# Patient Record
Sex: Female | Born: 1945 | Race: White | Hispanic: No | Smoking: Never smoker
Health system: Southern US, Community
[De-identification: ages and names within clinical notes are randomized; demographics above are authoritative.]

## PROBLEM LIST (undated history)

## (undated) DIAGNOSIS — E119 Type 2 diabetes mellitus without complications: Secondary | ICD-10-CM

## (undated) DIAGNOSIS — I10 Essential (primary) hypertension: Secondary | ICD-10-CM

## (undated) DIAGNOSIS — Z91018 Allergy to other foods: Secondary | ICD-10-CM

## (undated) DIAGNOSIS — M810 Age-related osteoporosis without current pathological fracture: Secondary | ICD-10-CM

## (undated) HISTORY — PX: COLON SURGERY: SHX602

## (undated) HISTORY — PX: INGUINAL HERNIA REPAIR: SUR1180

---

## 2005-04-24 ENCOUNTER — Emergency Department: Payer: Self-pay | Admitting: Emergency Medicine

## 2005-04-28 ENCOUNTER — Inpatient Hospital Stay: Payer: Self-pay | Admitting: Anesthesiology

## 2005-04-28 ENCOUNTER — Other Ambulatory Visit: Payer: Self-pay

## 2009-04-03 ENCOUNTER — Emergency Department: Payer: Self-pay | Admitting: Emergency Medicine

## 2009-12-07 ENCOUNTER — Emergency Department: Payer: Self-pay | Admitting: Emergency Medicine

## 2014-09-19 ENCOUNTER — Emergency Department: Payer: Self-pay | Admitting: Internal Medicine

## 2015-08-18 ENCOUNTER — Other Ambulatory Visit: Payer: Self-pay

## 2015-08-18 ENCOUNTER — Emergency Department
Admission: EM | Admit: 2015-08-18 | Discharge: 2015-08-18 | Disposition: A | Discharge status: Home / Self Care | Payer: Medicare Other | Attending: Emergency Medicine | Admitting: Emergency Medicine

## 2015-08-18 ENCOUNTER — Emergency Department: Payer: Medicare Other

## 2015-08-18 ENCOUNTER — Encounter: Payer: Self-pay | Admitting: Emergency Medicine

## 2015-08-18 DIAGNOSIS — I10 Essential (primary) hypertension: Secondary | ICD-10-CM | POA: Diagnosis not present

## 2015-08-18 DIAGNOSIS — R079 Chest pain, unspecified: Secondary | ICD-10-CM | POA: Insufficient documentation

## 2015-08-18 DIAGNOSIS — E119 Type 2 diabetes mellitus without complications: Secondary | ICD-10-CM | POA: Diagnosis not present

## 2015-08-18 DIAGNOSIS — Z91040 Latex allergy status: Secondary | ICD-10-CM | POA: Diagnosis not present

## 2015-08-18 HISTORY — DX: Type 2 diabetes mellitus without complications: E11.9

## 2015-08-18 HISTORY — DX: Essential (primary) hypertension: I10

## 2015-08-18 HISTORY — DX: Age-related osteoporosis without current pathological fracture: M81.0

## 2015-08-18 LAB — COMPREHENSIVE METABOLIC PANEL
ALT: 17 U/L (ref 14–54)
ANION GAP: 8 (ref 5–15)
AST: 22 U/L (ref 15–41)
Albumin: 4.2 g/dL (ref 3.5–5.0)
Alkaline Phosphatase: 85 U/L (ref 38–126)
BILIRUBIN TOTAL: 0.8 mg/dL (ref 0.3–1.2)
BUN: 13 mg/dL (ref 6–20)
CO2: 27 mmol/L (ref 22–32)
Calcium: 9.1 mg/dL (ref 8.9–10.3)
Chloride: 105 mmol/L (ref 101–111)
Creatinine, Ser: 0.68 mg/dL (ref 0.44–1.00)
GFR calc non Af Amer: >60 mL/min (ref >60)
Glucose, Bld: 122 mg/dL — ABNORMAL HIGH (ref 65–99)
Potassium: 3.9 mmol/L (ref 3.5–5.1)
Sodium: 140 mmol/L (ref 135–145)
TOTAL PROTEIN: 7 g/dL (ref 6.5–8.1)

## 2015-08-18 LAB — CBC WITH DIFFERENTIAL/PLATELET
Basophils Absolute: 0.1 10*3/uL (ref 0–0.1)
Basophils Relative: 1 %
EOS PCT: 4 %
Eosinophils Absolute: 0.4 10*3/uL (ref 0–0.7)
HCT: 41.9 % (ref 35.0–47.0)
HEMOGLOBIN: 13.8 g/dL (ref 12.0–16.0)
LYMPHS PCT: 31 %
Lymphs Abs: 3.3 10*3/uL (ref 1.0–3.6)
MCH: 29.8 pg (ref 26.0–34.0)
MCHC: 32.9 g/dL (ref 32.0–36.0)
MCV: 90.7 fL (ref 80.0–100.0)
Monocytes Absolute: 0.7 10*3/uL (ref 0.2–0.9)
Monocytes Relative: 7 %
NEUTROS PCT: 57 %
Neutro Abs: 6.1 10*3/uL (ref 1.4–6.5)
PLATELETS: 275 10*3/uL (ref 150–440)
RBC: 4.62 MIL/uL (ref 3.80–5.20)
RDW: 13.2 % (ref 11.5–14.5)
WBC: 10.6 10*3/uL (ref 3.6–11.0)

## 2015-08-18 LAB — FIBRIN DERIVATIVES D-DIMER (ARMC ONLY): FIBRIN DERIVATIVES D-DIMER (ARMC): 417.4 (ref 0–499)

## 2015-08-18 LAB — TROPONIN I: Troponin I: <0.03 ng/mL (ref <0.031)

## 2015-08-18 NOTE — ED Provider Notes (Signed)
Time Seen: Approximately ----------------------------------------- 3:48 PM on 08/18/2015 -----------------------------------------   I have reviewed the triage notes  Chief Complaint: Chest Pain   History of Present Illness: Karen Andrews is a 69 y.o. female who presents with some sudden onset of left-sided chest discomfort while she was shopping today. She states her real sharp pain started the upper part of her left side of her chest and radiated down to her left upper abdominal area. She states that the pain lasted 5 minutes and then since that time she still had developed some left-sided chest aching. She states the initial pain started approximately 10:30 to 11 AM. Patient denies any nausea, vomiting, shortness of breath. States she did have a lot of "" indigestion "" afterwards. She denies any back or flank discomfort. She denies any abdominal pain at present. She denies any fever or chills or pleuritic component to her discomfort. She denies any productive cough or chest wall pain. Patient's cardiac risk factors of hypertension and diabetes. She denies any high cholesterol or family history of ischemic heart disease.  Past Medical History  Diagnosis Date  . Hypertension   . Diabetes mellitus without complication   . Osteoporosis     There are no active problems to display for this patient.   Past Surgical History  Procedure Laterality Date  . Colon surgery    . Inguinal hernia repair      Past Surgical History  Procedure Laterality Date  . Colon surgery    . Inguinal hernia repair      No current outpatient prescriptions on file.  Allergies:  Flexeril; Other; and Latex  Family History: History reviewed. No pertinent family history.  Social History: Social History  Substance Use Topics  . Smoking status: Never Smoker   . Smokeless tobacco: None  . Alcohol Use: No     Review of Systems:   10 point review of systems was performed and was otherwise  negative:  Constitutional: No fever Eyes: No visual disturbances ENT: No sore throat, ear pain Cardiac: No chest pain Respiratory: No shortness of breath, wheezing, or stridor Abdomen: No abdominal pain, no vomiting, No diarrhea Endocrine: No weight loss, No night sweats Extremities: No peripheral edema, cyanosis Skin: No rashes, easy bruising Neurologic: No focal weakness, trouble with speech or swollowing Urologic: No dysuria, Hematuria, or urinary frequency   Physical Exam:  ED Triage Vitals  Enc Vitals Group     BP 08/18/15 1328 175/79 mmHg     Pulse Rate 08/18/15 1328 75     Resp 08/18/15 1328 18     Temp 08/18/15 1328 97.9 F (36.6 C)     Temp Source 08/18/15 1328 Oral     SpO2 08/18/15 1328 100 %     Weight 08/18/15 1328 250 lb (113.399 kg)     Height 08/18/15 1328  (1.727 m)     Head Cir --      Peak Flow --      Pain Score 08/18/15 1532 2     Pain Loc --      Pain Edu? --      Excl. in GC? --     General: Awake , Alert , and Oriented times 3; GCS 15 Head: Normal cephalic , atraumatic Eyes: Pupils equal , round, reactive to light Nose/Throat: No nasal drainage, patent upper airway without erythema or exudate.  Neck: Supple, Full range of motion, No anterior adenopathy or palpable thyroid masses Lungs: Clear to ascultation without wheezes ,  rhonchi, or rales Heart: Regular rate, regular rhythm without murmurs , gallops , or rubs Abdomen: Soft, non tender without rebound, guarding , or rigidity; bowel sounds positive and symmetric in all 4 quadrants. No organomegaly .        Extremities: 2 plus symmetric pulses. No edema, clubbing or cyanosis Neurologic: normal ambulation, Motor symmetric without deficits, sensory intact Skin: warm, dry, no rashes No reproducible chest wall pain  Labs:   All laboratory work was reviewed including any pertinent negatives or positives listed below:  Labs Reviewed  COMPREHENSIVE METABOLIC PANEL  CBC WITH  DIFFERENTIAL/PLATELET  TROPONIN I  FIBRIN DERIVATIVES D-DIMER ([facility] ONLY)   initial and repeat troponins were negative  EKG:  ED ECG REPORT I, Jennye Moccasin, the attending physician, personally viewed and interpreted this ECG.  Date: 08/18/2015 EKG Time: 1333 Rate: 65 Rhythm: normal sinus rhythm QRS Axis: normal Intervals: normal ST/T Wave abnormalities: normal Conduction Disutrbances: none Narrative Interpretation: unremarkable Poor R-wave progression in septal leads Minimal criteria for left ventricular hypertrophy No acute ischemic changes are noted  Radiology:     EXAM: CHEST 2 VIEW  COMPARISON: 12/07/2009  FINDINGS: Heart size and vascularity normal. Negative for heart failure or pneumonia. Negative for mass or effusion. Mild apical scarring bilaterally.  IMPRESSION: No active cardiopulmonary disease.    I personally reviewed the radiologic studies     ED Course: Differential includes all life-threatening causes for chest pain. This includes but is not exclusive to acute coronary syndrome, aortic dissection, pulmonary embolism, cardiac tamponade, community-acquired pneumonia, pericarditis, musculoskeletal chest wall pain, etc. Patient remained hemodynamically stable and not sure of the nature of her chest pain. Does not appear to be a life-threatening injury. At this was unlikely to be pulmonary embolism, unstable angina, aortic dissection etc. Patient did have some reflux-type symptoms at the time of her discomfort. She was advised to double up on her protonix at home. She's been advised to contact her primary physician for further evaluation.    Assessment: Acute unspecified chest pain   Final Clinical Impression: Acute unspecified chest pain  Final diagnoses:  None     Plan:   Patient was advised to return immediately if condition worsens. Patient was advised to follow up with her primary care physician or other specialized physicians  involved and in their current assessment.           Go to top  Jennye Moccasin, MD 08/18/15 2144

## 2015-08-18 NOTE — ED Notes (Signed)
[facility] main lab notified to add D-dimer, spoke with Bjorn Loser, states will add.

## 2015-08-18 NOTE — Discharge Instructions (Signed)

## 2015-08-18 NOTE — ED Notes (Signed)
Reports cp onset around 1230.  Skin w/d with good color.

## 2015-08-26 ENCOUNTER — Emergency Department
Admission: EM | Admit: 2015-08-26 | Discharge: 2015-08-26 | Disposition: A | Discharge status: Home / Self Care | Payer: Medicare Other | Attending: Emergency Medicine | Admitting: Emergency Medicine

## 2015-08-26 ENCOUNTER — Encounter: Payer: Self-pay | Admitting: Emergency Medicine

## 2015-08-26 DIAGNOSIS — I10 Essential (primary) hypertension: Secondary | ICD-10-CM | POA: Insufficient documentation

## 2015-08-26 DIAGNOSIS — E119 Type 2 diabetes mellitus without complications: Secondary | ICD-10-CM | POA: Insufficient documentation

## 2015-08-26 DIAGNOSIS — Z7982 Long term (current) use of aspirin: Secondary | ICD-10-CM | POA: Insufficient documentation

## 2015-08-26 DIAGNOSIS — Z91040 Latex allergy status: Secondary | ICD-10-CM | POA: Diagnosis not present

## 2015-08-26 DIAGNOSIS — F419 Anxiety disorder, unspecified: Secondary | ICD-10-CM | POA: Insufficient documentation

## 2015-08-26 DIAGNOSIS — R070 Pain in throat: Secondary | ICD-10-CM | POA: Insufficient documentation

## 2015-08-26 DIAGNOSIS — R079 Chest pain, unspecified: Secondary | ICD-10-CM | POA: Diagnosis not present

## 2015-08-26 DIAGNOSIS — Z79899 Other long term (current) drug therapy: Secondary | ICD-10-CM | POA: Insufficient documentation

## 2015-08-26 LAB — CBC WITH DIFFERENTIAL/PLATELET
BASOS ABS: 0.1 10*3/uL (ref 0–0.1)
Basophils Relative: 1 %
EOS ABS: 0.2 10*3/uL (ref 0–0.7)
EOS PCT: 2 %
HCT: 41.8 % (ref 35.0–47.0)
HEMOGLOBIN: 14 g/dL (ref 12.0–16.0)
LYMPHS ABS: 1.7 10*3/uL (ref 1.0–3.6)
Lymphocytes Relative: 17 %
MCH: 30.3 pg (ref 26.0–34.0)
MCHC: 33.6 g/dL (ref 32.0–36.0)
MCV: 90.3 fL (ref 80.0–100.0)
Monocytes Absolute: 0.7 10*3/uL (ref 0.2–0.9)
Monocytes Relative: 7 %
NEUTROS PCT: 73 %
Neutro Abs: 7.7 10*3/uL — ABNORMAL HIGH (ref 1.4–6.5)
PLATELETS: 269 10*3/uL (ref 150–440)
RBC: 4.63 MIL/uL (ref 3.80–5.20)
RDW: 12.8 % (ref 11.5–14.5)
WBC: 10.3 10*3/uL (ref 3.6–11.0)

## 2015-08-26 LAB — BASIC METABOLIC PANEL
ANION GAP: 10 (ref 5–15)
BUN: 13 mg/dL (ref 6–20)
CALCIUM: 9.1 mg/dL (ref 8.9–10.3)
CO2: 24 mmol/L (ref 22–32)
Chloride: 104 mmol/L (ref 101–111)
Creatinine, Ser: 0.76 mg/dL (ref 0.44–1.00)
Glucose, Bld: 184 mg/dL — ABNORMAL HIGH (ref 65–99)
POTASSIUM: 3.6 mmol/L (ref 3.5–5.1)
SODIUM: 138 mmol/L (ref 135–145)

## 2015-08-26 LAB — TROPONIN I

## 2015-08-26 MED ORDER — LORAZEPAM 0.5 MG PO TABS: 0.5000 mg | ORAL_TABLET | Freq: Three times a day (TID) | ORAL | Status: AC | PRN

## 2015-08-26 MED ORDER — LORAZEPAM 2 MG/ML IJ SOLN
0.5000 mg | Freq: Once | INTRAMUSCULAR | Status: AC
  Administered 2015-08-26: 0.5 mg via INTRAVENOUS
  Filled 2015-08-26: qty 1

## 2015-08-26 NOTE — ED Notes (Signed)
Brought in via ems from home with possible allergic reaction states she had 2 episodes of feeling like her throat is closing up .haivng some tightness in neck and headache. Lungs clear  No resp distress noted

## 2015-08-26 NOTE — ED Notes (Signed)
States she noticed an episode this am [place] that felt like her throat was closing. Than again this afternoon. Per ems she used her Epi pen prior to their arrival . Was given benadryl by ems. On arrival states she is having some tightness in throat and headache

## 2015-08-26 NOTE — Discharge Instructions (Signed)
It is not clear what is causing the tightness in your throat and the various other symptoms that you've experience over the past 8-10 days. A repeat of your cardiac enzyme and EKG showed no acute changes. Your blood results look good. You have been comfortable and stable during her stay here in the emergency department. There is a possibility that anxiety is triggering some of these events. This does not mean he does not have other medical issues that could also be causing these symptoms area you may take Ativan, 0.5 mg, 2 or 3 times a day, as needed. Follow-up with your regular doctor either at the end of this week or early next week. Return to the emergency department if you have worsening symptoms or other urgent concerns.

## 2015-08-26 NOTE — ED Provider Notes (Signed)
[facility] Emergency Department Provider Note  ____________________________________________  Time seen: 1330  I have reviewed the triage vital signs and the nursing notes.  History is from the patient and from her 2 daughters were present in the room.  HISTORY  Chief Complaint Tightness in throat, constellation of other symptoms.    HPI Karen Andrews is a 69 y.o. female who presents out of concern for having episodes of tightness in her throat. She reports she has had 4-5 of these episodes over the past 2 days. They last approximately 5 minutes. She reports she feels as though she needs to swallow to clear her throat when this occurs. Sometimes she feels a little bit short of breath with this. She denies any stridor and she denies having difficulty swallowing when she is drinking, though she has not tried to drink when she has had these episodes.  The patient was seen in the emergency department August 30 for chest pain. She was released in the emergency department and followed up with her regular doctor who is now scheduled her for a stress test.  In addition to the chest pain that she had at that time, she reports having had some pain in her left groin recently. This occurred last week. She had an occasional episode, but this has resolved and she is not having any pain today.  At this time, the patient is comfortable, without any difficulty breathing, tightness in her throat, difficulty swallowing, or other symptoms.  Patient does report that she has been diagnosed with Alpha Gal.   Past Medical History  Diagnosis Date  . Hypertension   . Diabetes mellitus without complication   . Osteoporosis     There are no active problems to display for this patient.   Past Surgical History  Procedure Laterality Date  . Colon surgery    . Inguinal hernia repair      Current Outpatient Rx  Name  Route  Sig  Dispense  Refill  . aspirin EC 81 MG tablet    Oral   Take 81 mg by mouth at bedtime.         . cetirizine (ZYRTEC) 10 MG tablet   Oral   Take 10 mg by mouth daily.         . Chromium (GTF CHROMIUM) 200 MCG TABS   Oral   Take 200 mcg by mouth 2 (two) times daily.         . ciclopirox (PENLAC) 8 % solution   Topical   Apply 1 application topically at bedtime.         . gabapentin (NEURONTIN) 100 MG capsule   Oral   Take 100 mg by mouth at bedtime.         Marland Kitchen glipiZIDE (GLUCOTROL XL) 10 MG 24 hr tablet   Oral   Take 10 mg by mouth 2 (two) times daily.         Marland Kitchen Hyaluronic Acid-Vitamin C (HYALURONIC ACID PO)   Oral   Take 1 capsule by mouth daily.         Marland Kitchen LORazepam (ATIVAN) 0.5 MG tablet   Oral   Take 1 tablet (0.5 mg total) by mouth every 8 (eight) hours as needed for anxiety.   12 tablet   0   . losartan (COZAAR) 25 MG tablet   Oral   Take 25 mg by mouth at bedtime.         . Multiple Minerals (CALCIUM/MAGNESIUM/ZINC PO)  Oral   Take 1 tablet by mouth at bedtime.         . Multiple Vitamin (MULTIVITAMIN WITH MINERALS) TABS tablet   Oral   Take 1 tablet by mouth daily.         . niacin (NIASPAN) 750 MG CR tablet   Oral   Take 750 mg by mouth at bedtime.         . pantoprazole (PROTONIX) 40 MG tablet   Oral   Take 40 mg by mouth daily.         . Vitamin D, Cholecalciferol, 1000 UNITS CAPS   Oral   Take 1,000 Units by mouth 2 (two) times daily.           Allergies Flexeril; Latex; Nickel; and Other  No family history on file.  Social History Social History  Substance Use Topics  . Smoking status: Never Smoker   . Smokeless tobacco: None  . Alcohol Use: No    Review of Systems  Constitutional: Negative for fever. ENT: Tightness in throat. See history of present illness. Cardiovascular: Chest pain over a week ago. See history of present illness and prior documentation from her visit August 30. Respiratory: Negative for shortness of breath. Gastrointestinal:  Negative for abdominal pain, vomiting and diarrhea. Genitourinary: Negative for dysuria. Musculoskeletal: Patient reports soreness and tenderness in her chest as well as discomfort in her left groin last week. Skin: Negative for rash. Neurological: Negative for headaches   10-point ROS otherwise negative.  ____________________________________________   PHYSICAL EXAM:  VITAL SIGNS: ED Triage Vitals  Enc Vitals Group     BP 08/26/15 1327 142/61 mmHg     Pulse Rate 08/26/15 1327 72     Resp 08/26/15 1327 16     Temp 08/26/15 1327 98.2 F (36.8 C)     Temp Source 08/26/15 1327 Oral     SpO2 08/26/15 1327 97 %     Weight 08/26/15 1327 243 lb (110.224 kg)     Height 08/26/15 1327  (1.727 m)     Head Cir --      Peak Flow --      Pain Score 08/26/15 1328 4     Pain Loc --      Pain Edu? --      Excl. in GC? --     Constitutional:  Alert and oriented. Well appearing and in no distress. ENT   Head: Normocephalic and atraumatic.   Nose: No congestion/rhinnorhea.   Mouth/Throat: Mucous membranes are moist. Cardiovascular: Normal rate, regular rhythm, no murmur noted Chest wall: Notable tenderness to the left pectoralis muscle. No tenderness to the right pectoralis. Respiratory:  Normal respiratory effort, no tachypnea.    Breath sounds are clear and equal bilaterally.  Gastrointestinal: Soft and nontender. No distention. No tenderness over the left inguinal area. No deformity noted. Back: No muscle spasm, no tenderness, no CVA tenderness. Musculoskeletal: No deformity noted. Nontender with normal range of motion in all extremities.  No noted edema. Neurologic:  Normal speech and language. No gross focal neurologic deficits are appreciated. Equal grip strength, negative pronator drift, 5 over 5 strength in all 4 extremities. Skin:  Skin is warm, dry. No rash noted. Psychiatric: Pleasant, communicative and articulate, slightly  anxious. ____________________________________________    LABS (pertinent positives/negatives)  Labs Reviewed  CBC WITH DIFFERENTIAL/PLATELET - Abnormal; Notable for the following:    Neutro Abs 7.7 (*)    All other components within normal limits  BASIC METABOLIC  PANEL - Abnormal; Notable for the following:    Glucose, Bld 184 (*)    All other components within normal limits  TROPONIN I     ____________________________________________   EKG  ED ECG REPORT I, Mylez Venable W, the attending physician, personally viewed and interpreted this ECG.   Date: 08/26/2015  EKG Time: 1357  Rate: 77  Rhythm: Normal sinus rhythm Left ventricular hypertrophy  Axis: Left axis deviation  Intervals: Normal  ST&T Change: None noted   ____________________________________________    INITIAL IMPRESSION / ASSESSMENT AND PLAN / ED COURSE  Pertinent labs & imaging results that were available during my care of the patient were reviewed by me and considered in my medical decision making (see chart for details).  Complicated and unclear situation. This patient has numerous complaints that don't seem to follow a specific pattern. This includes the chest pain, with at least some component that is musculoskeletal, 8-9 days ago, pain in her left groin, which has resolved, and now these intermittent episodes of tightness in her throat and intermittent headaches.  I've considered vascular causes for this set of symptoms, but I do not think the patient has a major vascular issue such as a dissection. She did have a negative D-dimer test when she was seen in the emergency department 8 days ago. There is a chance that her symptoms are connected with her alpha gal diagnosis. I do not think the tightness in her throat is actually an allergic reaction, but it may be reasonable to treat her with low-dose steroids. I have discussed this with the patient and we will readdress again.  I do think anxiety may be  playing a role in the tightness in her throat and others episodes. We will treat her with a small amount of Ativan currently and I discussed having her continue to take an anti-anxiety medicine just for 1 day to see if this helps as well as.  Meanwhile, we will reevaluate a troponin and regular blood work and repeat an EKG just to ensure that this patient who had recent chest pain has not had any new measurable change of these values.  ----------------------------------------- 3:21 PM on 08/26/2015 -----------------------------------------  Labs have been reviewed. Within normal limits except for glucose elevated at 184. The patient's EKG was reasonable with no acute changes. Reexam of the patient finds her comfortable without any ongoing symptoms. During this reexam, I have just been told that the patient did use her EpiPen prior to coming to the emergency department. She did this based on the advice to her from the 911 operator. I do not believe the symptoms she were having before was consistent with an allergy reaction or anaphylaxis. The patient again agrees that he did not feel the same as when she's had anaphylaxis before. We have discussed the pros and cons of steroids and agree not to use steroids at this time.  ____________________________________________   FINAL CLINICAL IMPRESSION(S) / ED DIAGNOSES  Final diagnoses:  Throat discomfort  Anxiety        Darien Ramus, MD 08/26/15 [phone]

## 2015-09-22 DIAGNOSIS — I10 Essential (primary) hypertension: Secondary | ICD-10-CM | POA: Insufficient documentation

## 2015-09-22 DIAGNOSIS — E119 Type 2 diabetes mellitus without complications: Secondary | ICD-10-CM | POA: Insufficient documentation

## 2016-06-08 DIAGNOSIS — K219 Gastro-esophageal reflux disease without esophagitis: Secondary | ICD-10-CM | POA: Insufficient documentation

## 2016-12-28 ENCOUNTER — Emergency Department
Admission: EM | Admit: 2016-12-28 | Discharge: 2016-12-28 | Disposition: A | Discharge status: Home / Self Care | Payer: Medicare Other | Attending: Emergency Medicine | Admitting: Emergency Medicine

## 2016-12-28 ENCOUNTER — Emergency Department: Payer: Medicare Other

## 2016-12-28 DIAGNOSIS — I10 Essential (primary) hypertension: Secondary | ICD-10-CM | POA: Diagnosis not present

## 2016-12-28 DIAGNOSIS — Z7984 Long term (current) use of oral hypoglycemic drugs: Secondary | ICD-10-CM | POA: Diagnosis not present

## 2016-12-28 DIAGNOSIS — Z91040 Latex allergy status: Secondary | ICD-10-CM | POA: Insufficient documentation

## 2016-12-28 DIAGNOSIS — E119 Type 2 diabetes mellitus without complications: Secondary | ICD-10-CM | POA: Insufficient documentation

## 2016-12-28 DIAGNOSIS — R42 Dizziness and giddiness: Secondary | ICD-10-CM | POA: Insufficient documentation

## 2016-12-28 DIAGNOSIS — Z7982 Long term (current) use of aspirin: Secondary | ICD-10-CM | POA: Diagnosis not present

## 2016-12-28 LAB — URINALYSIS, COMPLETE (UACMP) WITH MICROSCOPIC
Bilirubin Urine: NEGATIVE
Glucose, UA: NEGATIVE mg/dL
Hgb urine dipstick: NEGATIVE
KETONES UR: NEGATIVE mg/dL
Leukocytes, UA: NEGATIVE
Nitrite: NEGATIVE
PROTEIN: NEGATIVE mg/dL
Specific Gravity, Urine: 1.006 (ref 1.005–1.030)
pH: 7 (ref 5.0–8.0)

## 2016-12-28 LAB — CBC
HCT: 41.6 % (ref 35.0–47.0)
HEMOGLOBIN: 14 g/dL (ref 12.0–16.0)
MCH: 30.7 pg (ref 26.0–34.0)
MCHC: 33.7 g/dL (ref 32.0–36.0)
MCV: 91.1 fL (ref 80.0–100.0)
PLATELETS: 279 10*3/uL (ref 150–440)
RBC: 4.56 MIL/uL (ref 3.80–5.20)
RDW: 13 % (ref 11.5–14.5)
WBC: 8 10*3/uL (ref 3.6–11.0)

## 2016-12-28 LAB — BASIC METABOLIC PANEL
Anion gap: 9 (ref 5–15)
BUN: 12 mg/dL (ref 6–20)
CHLORIDE: 102 mmol/L (ref 101–111)
CO2: 27 mmol/L (ref 22–32)
CREATININE: 0.72 mg/dL (ref 0.44–1.00)
Calcium: 8.9 mg/dL (ref 8.9–10.3)
GFR calc non Af Amer: >60 mL/min (ref >60)
GLUCOSE: 203 mg/dL — AB (ref 65–99)
Potassium: 4.1 mmol/L (ref 3.5–5.1)
Sodium: 138 mmol/L (ref 135–145)

## 2016-12-28 LAB — TROPONIN I

## 2016-12-28 MED ORDER — MECLIZINE HCL 25 MG PO TABS: 25.0000 mg | ORAL_TABLET | Freq: Three times a day (TID) | ORAL | 0 refills | Status: AC | PRN

## 2016-12-28 MED ORDER — MECLIZINE HCL 25 MG PO TABS
25.0000 mg | ORAL_TABLET | Freq: Once | ORAL | Status: AC
  Administered 2016-12-28: 25 mg via ORAL
  Filled 2016-12-28 (×2): qty 1

## 2016-12-28 NOTE — ED Triage Notes (Signed)
Pt reports previously was told it was inner ear but reports no pain to ear now. Pt reports does have some pain to her cheek area occasional but thinks it is a root canal

## 2016-12-28 NOTE — ED Provider Notes (Signed)
[facility] Emergency Department Provider Note   ____________________________________________   First MD Initiated Contact with Patient 12/28/16 1423     (approximate)  I have reviewed the triage vital signs and the nursing notes.   HISTORY  Chief Complaint Weakness and Dizziness   HPI Karen Andrews is a 71 y.o. female with a history of hypertension, diabetes and TIA on aspirin, 81 mg daily, who is presenting to the emergency department today with lightheadedness and difficulty ambulating. She says that the symptoms started at about 745 this morning. She says they're worse with movement and walking. She denies any pain. Denies any nausea or vomiting. Said that her TIA symptoms felt similarly in the past. She denies any ear pain. Says that she has intermittent ringing in her ears which is chronic. She denies any weakness or numbness.  She denies a sensation of the room spinning when she says she feels more lightheaded with a fullness in her head. She is also concerned because several days ago she had a car door hit her in the head and she said she "saw stars." However, she denies losing consciousness.   Past Medical History:  Diagnosis Date  . Diabetes mellitus without complication   . Hypertension   . Osteoporosis     There are no active problems to display for this patient.   Past Surgical History:  Procedure Laterality Date  . COLON SURGERY    . INGUINAL HERNIA REPAIR      Prior to Admission medications   Medication Sig Start Date End Date Taking? Authorizing Provider  aspirin EC 81 MG tablet Take 81 mg by mouth at bedtime.    Historical Provider, MD  cetirizine (ZYRTEC) 10 MG tablet Take 10 mg by mouth daily.    Historical Provider, MD  Chromium (GTF CHROMIUM) 200 MCG TABS Take 200 mcg by mouth 2 (two) times daily.    Historical Provider, MD  ciclopirox (PENLAC) 8 % solution Apply 1 application topically at bedtime.    Historical Provider, MD   gabapentin (NEURONTIN) 100 MG capsule Take 100 mg by mouth at bedtime.    Historical Provider, MD  glipiZIDE (GLUCOTROL XL) 10 MG 24 hr tablet Take 10 mg by mouth 2 (two) times daily.    Historical Provider, MD  Hyaluronic Acid-Vitamin C (HYALURONIC ACID PO) Take 1 capsule by mouth daily.    Historical Provider, MD  losartan (COZAAR) 25 MG tablet Take 25 mg by mouth at bedtime.    Historical Provider, MD  Multiple Minerals (CALCIUM/MAGNESIUM/ZINC PO) Take 1 tablet by mouth at bedtime.    Historical Provider, MD  Multiple Vitamin (MULTIVITAMIN WITH MINERALS) TABS tablet Take 1 tablet by mouth daily.    Historical Provider, MD  niacin (NIASPAN) 750 MG CR tablet Take 750 mg by mouth at bedtime.    Historical Provider, MD  pantoprazole (PROTONIX) 40 MG tablet Take 40 mg by mouth daily.    Historical Provider, MD  Vitamin D, Cholecalciferol, 1000 UNITS CAPS Take 1,000 Units by mouth 2 (two) times daily.    Historical Provider, MD    Allergies Flexeril [cyclobenzaprine]; Latex; Nickel; and Other  No family history on file.  Social History Social History  Substance Use Topics  . Smoking status: Never Smoker  . Smokeless tobacco: Not on file  . Alcohol use No    Review of Systems Constitutional: No fever/chills Eyes: No visual changes. ENT: No sore throat. Cardiovascular: Denies chest pain. Respiratory: Denies shortness of breath. Gastrointestinal:  No abdominal pain.  No nausea, no vomiting.  No diarrhea.  No constipation. Genitourinary: Negative for dysuria. Musculoskeletal: Negative for back pain. Skin: Negative for rash. Neurological: Negative for headaches, focal weakness or numbness.  10-point ROS otherwise negative.  ____________________________________________   PHYSICAL EXAM:  VITAL SIGNS: ED Triage Vitals  Enc Vitals Group     BP 12/28/16 1053 (!) 148/74     Pulse Rate 12/28/16 1053 65     Resp 12/28/16 1053 18     Temp 12/28/16 1053 97.9 F (36.6 C)     Temp  Source 12/28/16 1053 Oral     SpO2 12/28/16 1053 97 %     Weight 12/28/16 1054 250 lb (113.4 kg)     Height 12/28/16 1054 5\' 8"  (1.727 m)     Head Circumference --      Peak Flow --      Pain Score --      Pain Loc --      Pain Edu? --      Excl. in GC? --     Constitutional: Alert and oriented. Well appearing and in no acute distress. Eyes: Conjunctivae are normal. PERRL. EOMI. leftward going nystagmus. No vertical nystagmus. Head: Atraumatic. TMs are normal bilaterally. Nose: No congestion/rhinnorhea. Mouth/Throat: Mucous membranes are moist.  Neck: No stridor.   Cardiovascular: Normal rate, regular rhythm. Grossly normal heart sounds.   Respiratory: Normal respiratory effort.  No retractions. Lungs CTAB. Gastrointestinal: Soft and nontender. No distention.  Musculoskeletal: No lower extremity tenderness nor edema.  No joint effusions. Neurologic:  Normal speech and language. No gross focal neurologic deficits are appreciated. No ataxia on finger to nose testing. Skin:  Skin is warm, dry and intact. No rash noted. Psychiatric: Mood and affect are normal. Speech and behavior are normal.  NIH Stroke Scale   Time: 2:53 PM Person Administering Scale: Arelia Longest  Administer stroke scale items in the order listed. Record performance in each category after each subscale exam. Do not go back and change scores. Follow directions provided for each exam technique. Scores should reflect what the patient does, not what the clinician thinks the patient can do. The clinician should record answers while administering the exam and work quickly. Except where indicated, the patient should not be coached (i.e., repeated requests to patient to make a special effort).   1a  Level of consciousness: 0=alert; keenly responsive  1b. LOC questions:  0=Performs both tasks correctly  1c. LOC commands: 0=Performs both tasks correctly  2.  Best Gaze: 0=normal  3.  Visual: 0=No visual loss  4.  Facial Palsy: 0=Normal symmetric movement  5a.  Motor left arm: 0=No drift, limb holds 90 (or 45) degrees for full 10 seconds  5b.  Motor right arm: 0=No drift, limb holds 90 (or 45) degrees for full 10 seconds  6a. motor left leg: 0=No drift, limb holds 90 (or 45) degrees for full 10 seconds  6b  Motor right leg:  0=No drift, limb holds 90 (or 45) degrees for full 10 seconds  7. Limb Ataxia: 0=Absent  8.  Sensory: 0=Normal; no sensory loss  9. Best Language:  0=No aphasia, normal  10. Dysarthria: 0=Normal  11. Extinction and Inattention: 0=No abnormality  12. Distal motor function: 0=Normal   Total:   0    ____________________________________________   LABS (all labs ordered are listed, but only abnormal results are displayed)  Labs Reviewed  BASIC METABOLIC PANEL - Abnormal; Notable for the following:  Result Value   Glucose, Bld 203 (*)    All other components within normal limits  CBC  URINALYSIS, COMPLETE (UACMP) WITH MICROSCOPIC  TROPONIN I  CBG MONITORING, ED   ____________________________________________  EKG  ED ECG REPORT I, Torianne Laflam,  Teena Irani, the attending physician, personally viewed and interpreted this ECG.   Date: 12/28/2016  EKG Time: 1106  Rate: 63  Rhythm: normal sinus rhythm  Axis: left axis  Intervals:incomplete right bundle branch block  ST&T Change: No ST segment elevation or depression. No abnormal T-wave inversion.  ____________________________________________  RADIOLOGY  MR Brain Wo Contrast (Final result)  Result time 12/28/16 16:44:40  Final result by Davonna Belling, MD (12/28/16 16:44:40)           Narrative:   CLINICAL DATA: Weak and dizzy when getting out of bed earlier today. Vertigo. Gait abnormality. No focal neurologic deficit is reported.  EXAM: MRI HEAD WITHOUT CONTRAST  TECHNIQUE: Multiplanar, multiecho pulse sequences of the brain and surrounding structures were obtained without intravenous  contrast.  COMPARISON: CT head 12/28/2016 at 1452 hours. MR head 04/29/2005.  FINDINGS: Brain: No evidence for acute infarction, hemorrhage, mass lesion, hydrocephalus, or extra-axial fluid. Minor subcortical and periventricular white matter disease, nonspecific, consistent with early chronic microvascular ischemic change, given the patient's hypertension and diabetes history.  Vascular: Normal flow voids.  Skull and upper cervical spine: Normal marrow signal.  Sinuses/Orbits: Negative.  Other: None.  IMPRESSION: Mild atrophy. Minor white matter disease.  No acute intracranial findings.  No evidence for posterior circulation ischemia, intracranial mass lesion, or acute vascular occlusion.   Electronically Signed By: Elsie Stain M.D. On: 12/28/2016 16:44            CT Head Wo Contrast (Final result)  Result time 12/28/16 14:58:56  Final result by Oley Balm, MD (12/28/16 14:58:56)           Narrative:   CLINICAL DATA: Patient complains of dizziness since this morning. Hx of melanoma. Hx of TIA.  EXAM: CT HEAD WITHOUT CONTRAST  TECHNIQUE: Contiguous axial images were obtained from the base of the skull through the vertex without intravenous contrast.  COMPARISON: MR 04/29/2005  FINDINGS: Brain: No evidence of acute infarction, hemorrhage, hydrocephalus, extra-axial collection or mass lesion/mass effect. Mild atrophy.  Vascular: No hyperdense vessel or unexpected calcification.  Skull: Normal. Negative for fracture or focal lesion.  Sinuses/Orbits: No acute finding.  Other: None.  IMPRESSION: 1. Negative for bleed or other acute intracranial process.   Electronically Signed By: Corlis Leak M.D. On: 12/28/2016 14:58            ____________________________________________   PROCEDURES  Procedure(s) performed: None  Procedures  Critical Care performed:  No  ____________________________________________   INITIAL IMPRESSION / ASSESSMENT AND PLAN / ED COURSE  Pertinent labs & imaging results that were available during my care of the patient were reviewed by me and considered in my medical decision making (see chart for details).   Clinical Course   ----------------------------------------- 2:54 PM on 12/28/2016 -----------------------------------------  I discussed the case with Dr. Thad Ranger of neurology who agrees that the patient has an MRI and feels improved with meclizine that she is appropriate for discharge to home with outpatient neurology follow-up.  ----------------------------------------- 5:18 PM on 12/28/2016 -----------------------------------------  Patient feels improved with meclizine. Very reassuring MRI as well as CT of the brain. She is able to ambulate with a normal gait. She'll be discharged with meclizine. She will continue her aspirin. We discussed follow-up with  neurology and she is understanding and willing to comply. She knows to return to the hospital for any worsening or concerning symptoms for reevaluation. Likely peripheral vertigo. Symptoms which did not resolve within one hour without signs of stroke on MRI.   ____________________________________________   FINAL CLINICAL IMPRESSION(S) / ED DIAGNOSES  Vertigo.    NEW MEDICATIONS STARTED DURING THIS VISIT:  New Prescriptions   No medications on file     Note:  This document was prepared using Dragon voice recognition software and may include unintentional dictation errors.    Myrna Blazeravid Matthew Olando Willems, MD 12/28/16 [phone]

## 2016-12-28 NOTE — ED Notes (Signed)
Pt alert and oriented X4, active, cooperative, pt in NAD. RR even and unlabored, color WNL.  Pt informed to return if any life threatening symptoms occur.   

## 2016-12-28 NOTE — ED Triage Notes (Signed)
Pt reports this am felt weak and dizzy when she got up out of bed. Pt denies pain, PERRLA, grips equal bilaterally, no facial drooping noted. Pt reports doesn't feel well. Pt reports has felt dizzy in the past before where the room was spinning.

## 2018-05-08 DIAGNOSIS — E1140 Type 2 diabetes mellitus with diabetic neuropathy, unspecified: Secondary | ICD-10-CM | POA: Insufficient documentation

## 2018-06-28 DIAGNOSIS — E042 Nontoxic multinodular goiter: Secondary | ICD-10-CM | POA: Insufficient documentation

## 2020-08-22 ENCOUNTER — Emergency Department
Admission: EM | Admit: 2020-08-22 | Discharge: 2020-08-22 | Disposition: A | Discharge status: Home / Self Care | Payer: Medicare Other | Attending: Emergency Medicine | Admitting: Emergency Medicine

## 2020-08-22 ENCOUNTER — Emergency Department: Payer: Medicare Other

## 2020-08-22 ENCOUNTER — Encounter: Payer: Self-pay | Admitting: Intensive Care

## 2020-08-22 ENCOUNTER — Other Ambulatory Visit: Payer: Self-pay

## 2020-08-22 DIAGNOSIS — Z7982 Long term (current) use of aspirin: Secondary | ICD-10-CM | POA: Diagnosis not present

## 2020-08-22 DIAGNOSIS — I10 Essential (primary) hypertension: Secondary | ICD-10-CM | POA: Diagnosis not present

## 2020-08-22 DIAGNOSIS — Z91040 Latex allergy status: Secondary | ICD-10-CM | POA: Insufficient documentation

## 2020-08-22 DIAGNOSIS — Z79899 Other long term (current) drug therapy: Secondary | ICD-10-CM | POA: Insufficient documentation

## 2020-08-22 DIAGNOSIS — R079 Chest pain, unspecified: Secondary | ICD-10-CM | POA: Diagnosis not present

## 2020-08-22 DIAGNOSIS — E119 Type 2 diabetes mellitus without complications: Secondary | ICD-10-CM | POA: Insufficient documentation

## 2020-08-22 HISTORY — DX: Allergy to other foods: Z91.018

## 2020-08-22 LAB — BASIC METABOLIC PANEL
Anion gap: 8 (ref 5–15)
BUN: 11 mg/dL (ref 8–23)
CO2: 27 mmol/L (ref 22–32)
Calcium: 8.6 mg/dL — ABNORMAL LOW (ref 8.9–10.3)
Chloride: 101 mmol/L (ref 98–111)
Creatinine, Ser: 0.76 mg/dL (ref 0.44–1.00)
GFR calc Af Amer: >60 mL/min (ref >60)
GFR calc non Af Amer: >60 mL/min (ref >60)
Glucose, Bld: 147 mg/dL — ABNORMAL HIGH (ref 70–99)
Potassium: 4 mmol/L (ref 3.5–5.1)
Sodium: 136 mmol/L (ref 135–145)

## 2020-08-22 LAB — TROPONIN I (HIGH SENSITIVITY)
Troponin I (High Sensitivity): 4 ng/L (ref <18)
Troponin I (High Sensitivity): 3 ng/L (ref <18)

## 2020-08-22 LAB — CBC
HCT: 43.8 % (ref 36.0–46.0)
Hemoglobin: 14.7 g/dL (ref 12.0–15.0)
MCH: 31 pg (ref 26.0–34.0)
MCHC: 33.6 g/dL (ref 30.0–36.0)
MCV: 92.4 fL (ref 80.0–100.0)
Platelets: 289 10*3/uL (ref 150–400)
RBC: 4.74 MIL/uL (ref 3.87–5.11)
RDW: 12 % (ref 11.5–15.5)
WBC: 6.1 10*3/uL (ref 4.0–10.5)
nRBC: 0 % (ref 0.0–0.2)

## 2020-08-22 NOTE — ED Triage Notes (Signed)
Presents with sharp/sore left sided chest pain. Radiation to left neck

## 2020-08-22 NOTE — ED Notes (Signed)
Pt presents to ED for L sided CP that started around 0630 this AM, pt states pain radiated across her chest. Denies fever, SOB, NVD. Pain has subsided at this time but feels soreness. Denies cardiac hx. Pt A&Ox4 and NAD at this time.

## 2020-08-22 NOTE — ED Notes (Signed)
Collected green top and lav top, sent to lab pending orders.

## 2020-08-22 NOTE — ED Provider Notes (Signed)
[facility] Emergency Department Provider Note   ____________________________________________   First MD Initiated Contact with Patient 08/22/20 1322     (approximate)  I have reviewed the triage vital signs and the nursing notes.   HISTORY  Chief Complaint Chest Pain    HPI Karen Andrews is a 74 y.o. female with past medical history of hypertension and diabetes who presents to the ED complaining of chest pain.  Patient reports that she was laying in bed this morning around 6:30 AM when she experienced a sharp stabbing pain radiating from the left side to the center of her chest.  Pain lasted for a couple minutes before resolving on its own, but she had 2 additional episodes of similar pain.  These episodes again lasted for a couple minutes before resolving on their own.  She decided to get checked out in the ED, but now states that her pain is resolved and she has not had any further episodes since this morning.  She denies any associated fevers, cough, or shortness of breath.  She does not have any cardiac history and denies any history of similar symptoms.        Past Medical History:  Diagnosis Date  . Allergy to alpha-gal   . Diabetes mellitus without complication (HCC)   . Hypertension   . Osteoporosis     There are no problems to display for this patient.   Past Surgical History:  Procedure Laterality Date  . COLON SURGERY    . INGUINAL HERNIA REPAIR      Prior to Admission medications   Medication Sig Start Date End Date Taking? Authorizing Provider  aspirin EC 81 MG tablet Take 81 mg by mouth at bedtime.    [provider]  cetirizine (ZYRTEC) 10 MG tablet Take 10 mg by mouth daily.    [provider]  Chromium (GTF CHROMIUM) 200 MCG TABS Take 200 mcg by mouth 2 (two) times daily.    [provider]  ciclopirox (PENLAC) 8 % solution Apply 1 application topically at bedtime.    [provider]    gabapentin (NEURONTIN) 100 MG capsule Take 100 mg by mouth at bedtime.    [provider]  glipiZIDE (GLUCOTROL XL) 10 MG 24 hr tablet Take 10 mg by mouth 2 (two) times daily.    [provider]  Hyaluronic Acid-Vitamin C (HYALURONIC ACID PO) Take 1 capsule by mouth daily.    [provider]  losartan (COZAAR) 25 MG tablet Take 25 mg by mouth at bedtime.    [provider]  meclizine (ANTIVERT) 25 MG tablet Take 1 tablet (25 mg total) by mouth 3 (three) times daily as needed for dizziness. 12/28/16   Myrna Blazer, MD  Multiple Minerals (CALCIUM/MAGNESIUM/ZINC PO) Take 1 tablet by mouth at bedtime.    [provider]  Multiple Vitamin (MULTIVITAMIN WITH MINERALS) TABS tablet Take 1 tablet by mouth daily.    [provider]  niacin (NIASPAN) 750 MG CR tablet Take 750 mg by mouth at bedtime.    [provider]  pantoprazole (PROTONIX) 40 MG tablet Take 40 mg by mouth daily.    [provider]  Vitamin D, Cholecalciferol, 1000 UNITS CAPS Take 1,000 Units by mouth 2 (two) times daily.    [provider]    Allergies Flexeril [cyclobenzaprine], Meat [alpha-gal], Latex, Nickel, and Other  History reviewed. No pertinent family history.  Social History Social History   Tobacco Use  .  Smoking status: Never Smoker  . Smokeless tobacco: Never Used  Substance Use Topics  . Alcohol use: No  . Drug use: Never    Review of Systems  Constitutional: No fever/chills Eyes: No visual changes. ENT: No sore throat. Cardiovascular: Positive for chest pain. Respiratory: Denies shortness of breath. Gastrointestinal: No abdominal pain.  No nausea, no vomiting.  No diarrhea.  No constipation. Genitourinary: Negative for dysuria. Musculoskeletal: Negative for back pain. Skin: Negative for rash. Neurological: Negative for headaches, focal weakness or  numbness.  ____________________________________________   PHYSICAL EXAM:  VITAL SIGNS: ED Triage Vitals  Enc Vitals Group     BP 08/22/20 0911 (!) 146/63     Pulse Rate 08/22/20 0911 63     Resp 08/22/20 0911 20     Temp 08/22/20 0911 98.3 F (36.8 C)     Temp Source 08/22/20 0911 Oral     SpO2 08/22/20 0911 97 %     Weight 08/22/20 0912 230 lb (104.3 kg)     Height 08/22/20 0912 5\' 8"  (1.727 m)     Head Circumference --      Peak Flow --      Pain Score 08/22/20 0930 2     Pain Loc --      Pain Edu? --      Excl. in GC? --     Constitutional: Alert and oriented. Eyes: Conjunctivae are normal. Head: Atraumatic. Nose: No congestion/rhinnorhea. Mouth/Throat: Mucous membranes are moist. Neck: Normal ROM Cardiovascular: Normal rate, regular rhythm. Grossly normal heart sounds. Respiratory: Normal respiratory effort.  No retractions. Lungs CTAB.  No chest wall tenderness to palpation. Gastrointestinal: Soft and nontender. No distention. Genitourinary: deferred Musculoskeletal: No lower extremity tenderness nor edema. Neurologic:  Normal speech and language. No gross focal neurologic deficits are appreciated. Skin:  Skin is warm, dry and intact. No rash noted. Psychiatric: Mood and affect are normal. Speech and behavior are normal.  ____________________________________________   LABS (all labs ordered are listed, but only abnormal results are displayed)  Labs Reviewed  BASIC METABOLIC PANEL - Abnormal; Notable for the following components:      Result Value   Glucose, Bld 147 (*)    Calcium 8.6 (*)    All other components within normal limits  CBC  TROPONIN I (HIGH SENSITIVITY)  TROPONIN I (HIGH SENSITIVITY)   ____________________________________________  EKG  ED ECG REPORT I, 10/22/20, the attending physician, personally viewed and interpreted this ECG.   Date: 08/22/2020  EKG Time: 9:02  Rate: 62  Rhythm: normal sinus rhythm  Axis: LAD   Intervals:none  ST&T Change: LVH   PROCEDURES  Procedure(s) performed (including Critical Care):  Procedures   ____________________________________________   INITIAL IMPRESSION / ASSESSMENT AND PLAN / ED COURSE       74 year old female with past medical history of hypertension and diabetes presents to the ED with 3 separate episodes of sharp stabbing chest pain over her left chest starting this morning, each of which lasted a couple minutes and then has resolved.  She has been asymptomatic since then, denies any chest pain currently and has not had any shortness of breath.  Vital signs are reassuring, EKG shows no evidence of arrhythmia or ischemia.  Chest x-ray is also unremarkable and initial labs reassuring, troponin within normal limits.  We will repeat troponin but if this is within normal limits I doubt ACS as the source of her symptoms.  As she is asymptomatic at this time with reassuring vital signs,  I doubt PE.  She would be appropriate for discharge home with PCP follow-up if repeat troponin within normal limits.  Repeat troponin within normal limits and patient remains symptom-free at this time.  She is appropriate for discharge home with PCP follow-up, was counseled to return to the ED for new or worsening symptoms.  Patient agrees with plan.      ____________________________________________   FINAL CLINICAL IMPRESSION(S) / ED DIAGNOSES  Final diagnoses:  Nonspecific chest pain     ED Discharge Orders    None       Note:  This document was prepared using Dragon voice recognition software and may include unintentional dictation errors.   Chesley Noon, MD 08/22/20 [phone]

## 2020-11-05 IMAGING — CR DG CHEST 2V
1 series · 2 of 2 positions shown · non-contrast
Comparison: August 18, 2015.

CLINICAL DATA: Chest pain.

EXAM:
CHEST - 2 VIEW

[Series 1: dg chest 2 view · 0.14mm/px · 2 of 2 slices shown]
[im 1/2]
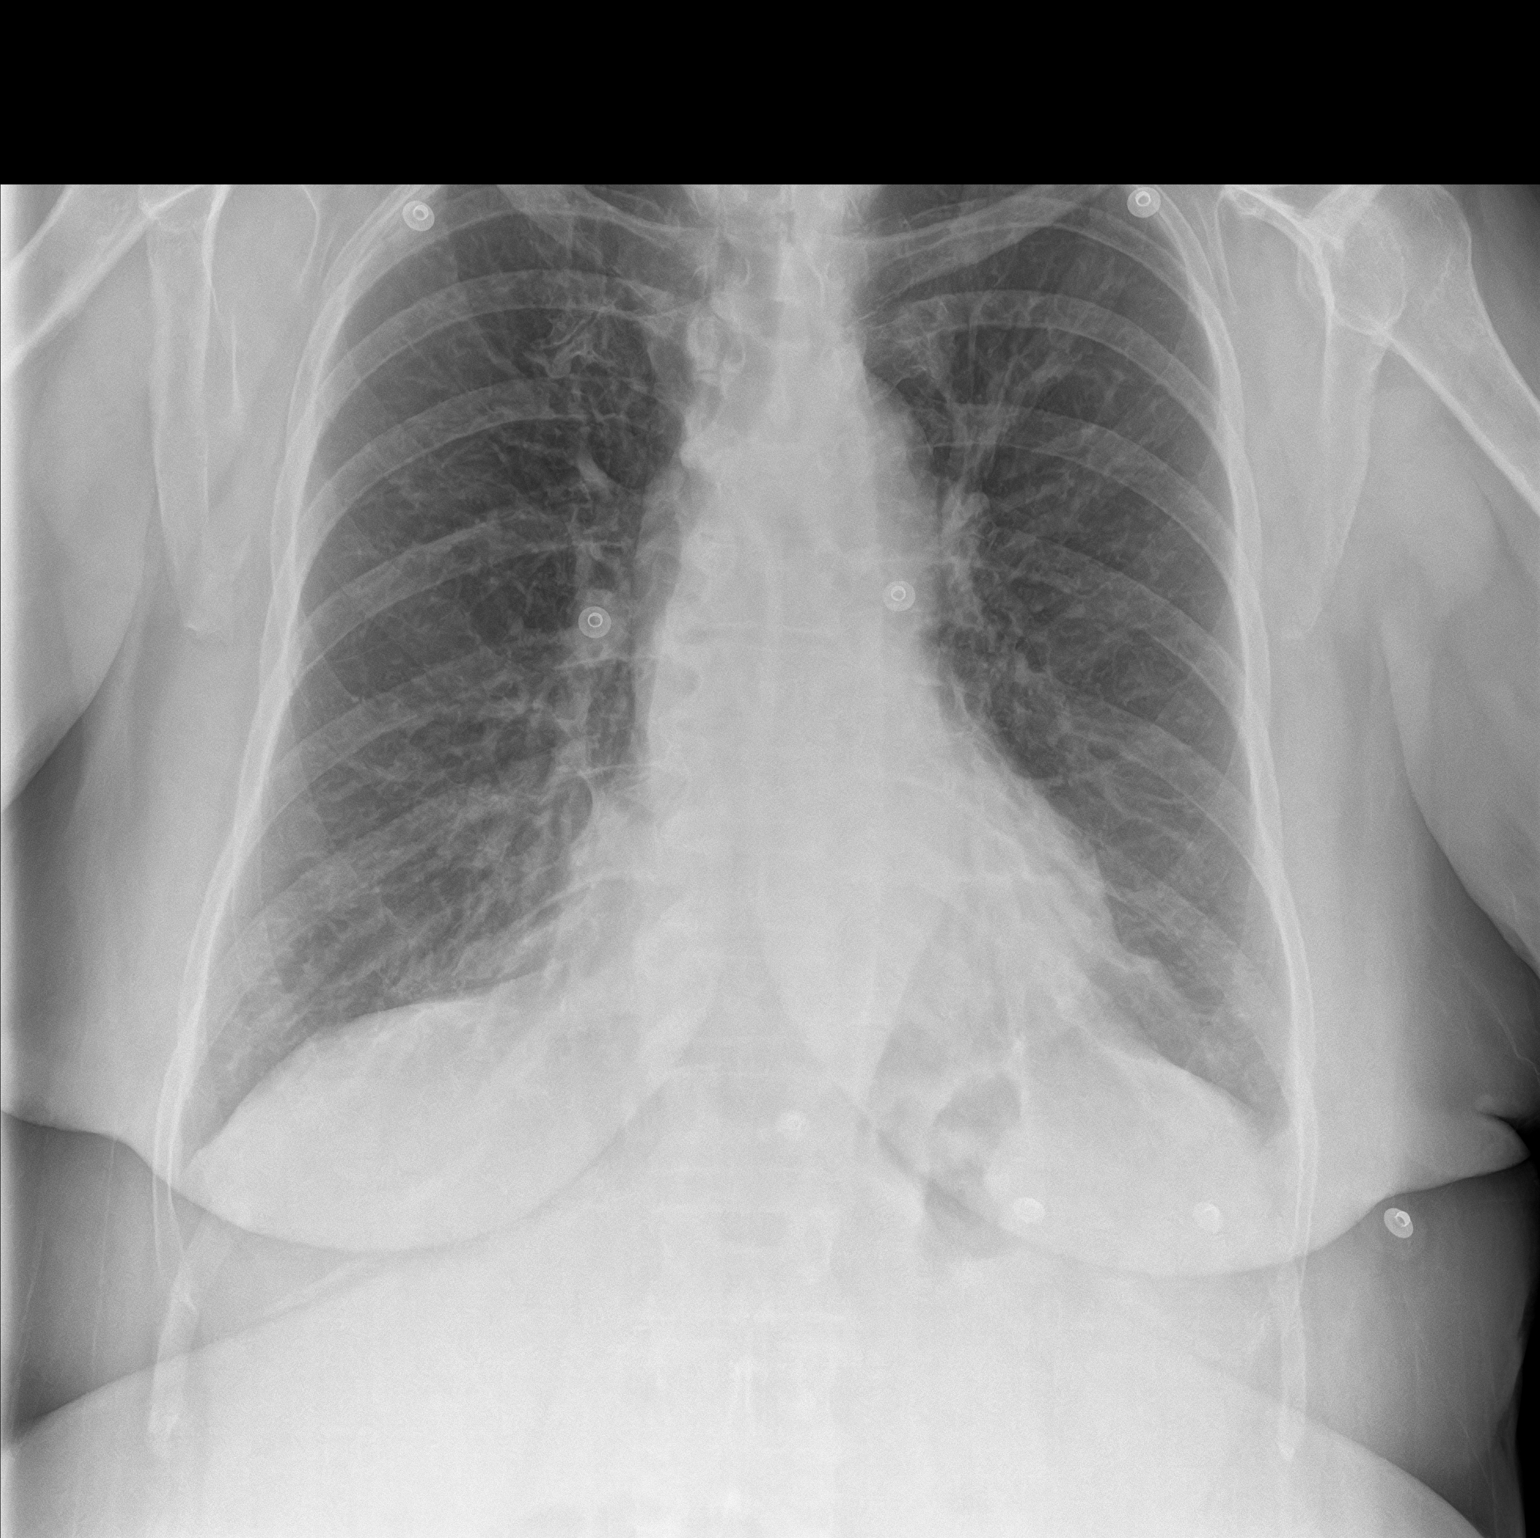
[im 2/2]
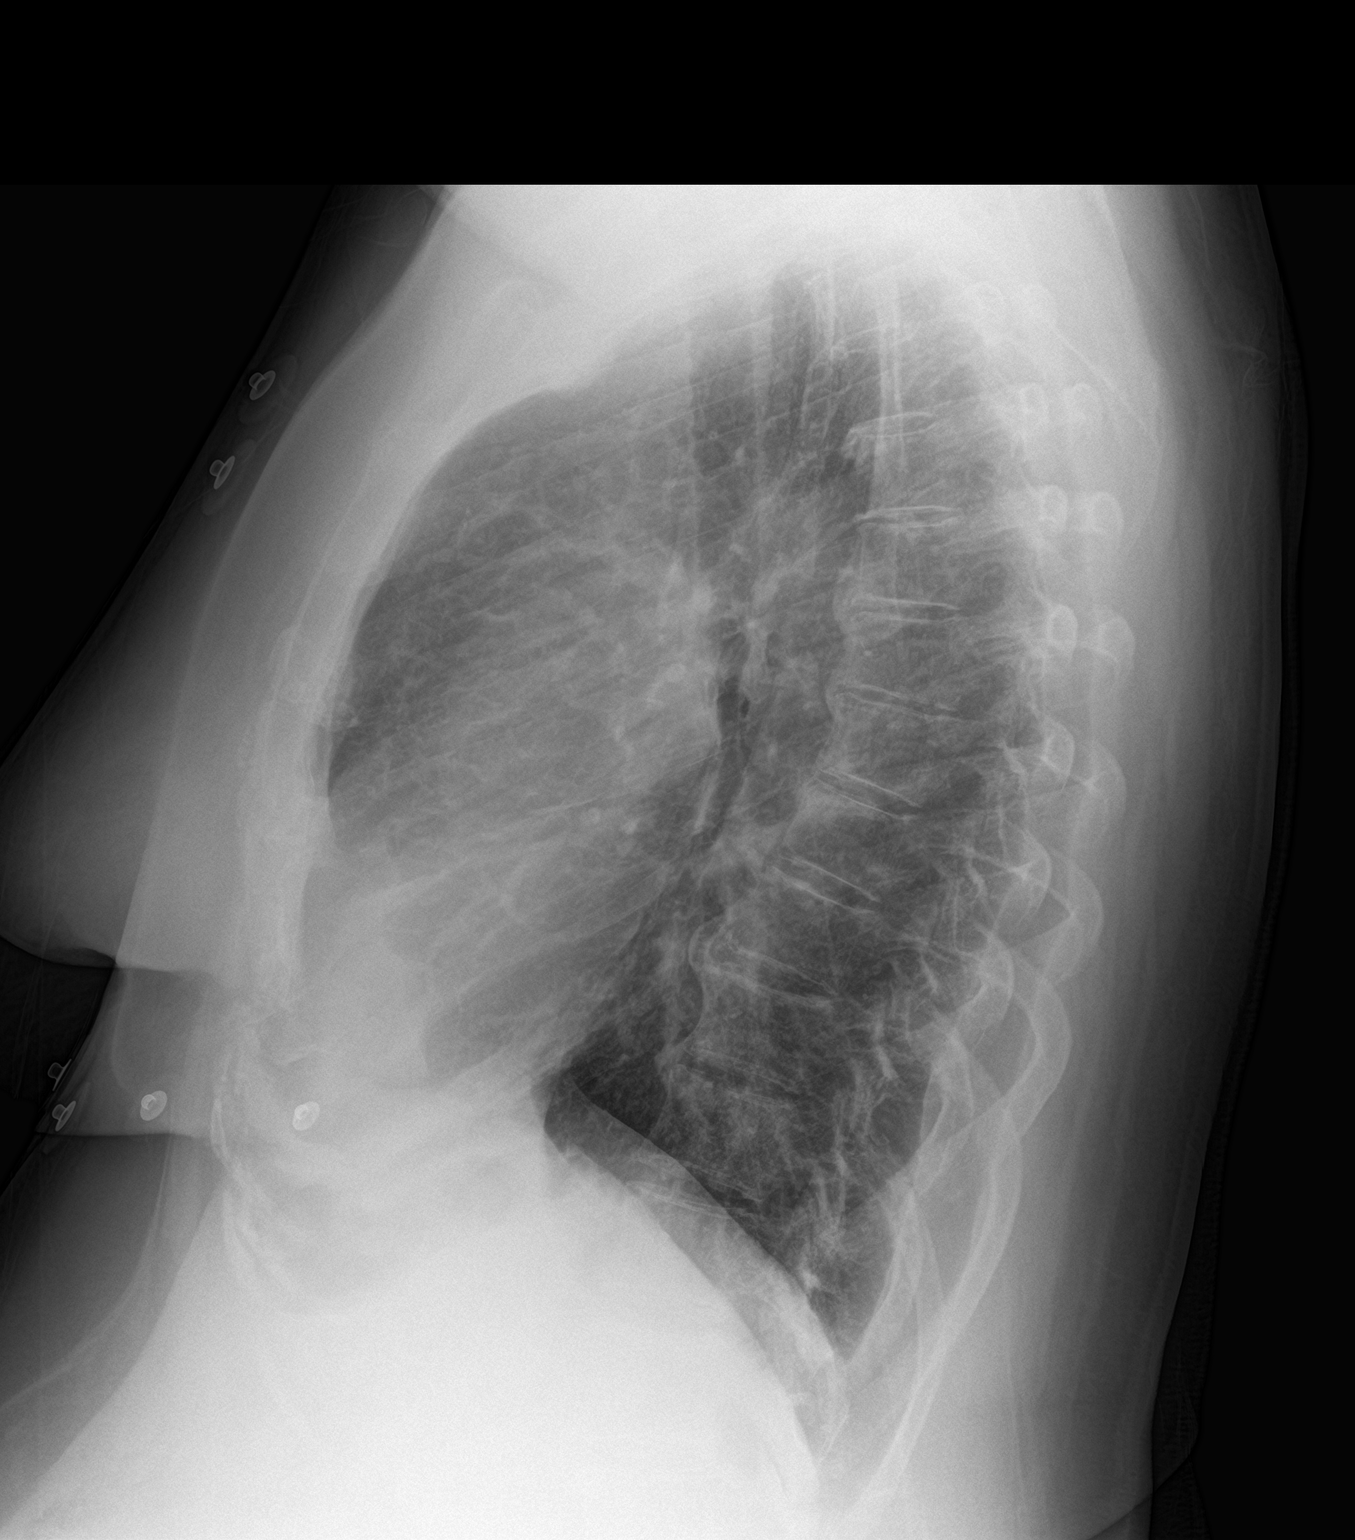

[2 of 2 positions shown; findings below may reference images not displayed]

FINDINGS: The heart size and mediastinal contours are within normal limits.
Both lungs are clear. No pneumothorax or pleural effusion is noted.
The visualized skeletal structures are unremarkable.
IMPRESSION: No active cardiopulmonary disease.

## 2021-03-12 ENCOUNTER — Ambulatory Visit: Payer: Medicare Other

## 2021-03-12 NOTE — Progress Notes (Signed)
[facility] [address] [place], [place] [address]  Pulmonary Sleep Medicine   Office Visit Note  Patient Name: Karen Andrews DOB: 12/19/1946 MRN [id number]    Chief Complaint: Obstructive Sleep Apnea visit  Brief History:  Karen Andrews is seen today for follow up The patient has a 14 year history of sleep apnea. Patient is using PAP nightly.  The patient feels more rested after sleeping with PAP.  The patient reports benefiting from PAP use. Reported sleepiness is  improved and the Epworth Sleepiness Score is 4 out of 24. The patient rarely take naps. The patient complains of the following: no complaints  The compliance download shows excellent compliance with an average use time of 7.8 hours. The AHI is 0.4  The patient does not complain of limb movements disrupting sleep.  ROS  General: (-) fever, (-) chills, (-) night sweat Nose and Sinuses: (-) nasal stuffiness or itchiness, (-) postnasal drip, (-) nosebleeds, (-) sinus trouble. Mouth and Throat: (-) sore throat, (-) hoarseness. Neck: (-) swollen glands, (-) enlarged thyroid, (-) neck pain. Respiratory: - cough, - shortness of breath, - wheezing. Neurologic: pinky finger on left finger is numb,  No tingling.  Psych: no anxiety/depression.    Current Medication: Outpatient Encounter Medications as of 03/15/2021  Medication Sig  . EPINEPHrine 0.3 mg/0.3 mL IJ SOAJ injection epinephrine 0.3 mg/0.3 mL injection, auto-injector  . fluticasone (FLONASE) 50 MCG/ACT nasal spray 1 spray.  . gabapentin (NEURONTIN) 600 MG tablet Take 0.5 tablets by mouth at bedtime.  Marland Kitchen glipiZIDE (GLUCOTROL XL) 10 MG 24 hr tablet Take by mouth.  Marland Kitchen glucose 4 GM chewable tablet Chew by mouth.  . hydrOXYzine (ATARAX/VISTARIL) 25 MG tablet Take by mouth.  . losartan (COZAAR) 25 MG tablet Take by mouth.  . niacin (NIASPAN) 500 MG CR tablet Take by mouth.  Marland Kitchen ascorbic Acid (VITAMIN C) 500 MG CPCR Take 1 capsule by mouth daily.  Marland Kitchen aspirin EC 81  MG tablet Take 81 mg by mouth at bedtime.  . cetirizine (ZYRTEC) 10 MG tablet Take 10 mg by mouth daily.  . Chromium (GTF CHROMIUM) 200 MCG TABS Take 200 mcg by mouth 2 (two) times daily.  . ciclopirox (PENLAC) 8 % solution Apply 1 application topically at bedtime.  . Cyanocobalamin 1000 MCG TBCR Take by mouth.  . gabapentin (NEURONTIN) 100 MG capsule Take 100 mg by mouth at bedtime.  Marland Kitchen glipiZIDE (GLUCOTROL XL) 10 MG 24 hr tablet Take 10 mg by mouth 2 (two) times daily.  Marland Kitchen Hyaluronic Acid-Vitamin C (HYALURONIC ACID PO) Take 1 capsule by mouth daily.  Marland Kitchen losartan (COZAAR) 25 MG tablet Take 25 mg by mouth at bedtime.  . meclizine (ANTIVERT) 25 MG tablet Take 1 tablet (25 mg total) by mouth 3 (three) times daily as needed for dizziness.  . Multiple Minerals (CALCIUM/MAGNESIUM/ZINC PO) Take 1 tablet by mouth at bedtime.  . Multiple Vitamin (MULTIVITAMIN WITH MINERALS) TABS tablet Take 1 tablet by mouth daily.  . niacin (NIASPAN) 750 MG CR tablet Take 750 mg by mouth at bedtime.  . STUDY - ASPIRE - aspirin 81 mg or placebo tablet (PI-Sethi) Take by mouth.  . Vitamin D, Cholecalciferol, 1000 UNITS CAPS Take 1,000 Units by mouth 2 (two) times daily.  Marland Kitchen zinc gluconate 50 MG tablet Take by mouth.  . [DISCONTINUED] pantoprazole (PROTONIX) 40 MG tablet Take 40 mg by mouth daily.   No facility-administered encounter medications on file as of 03/15/2021.    Surgical History: Past Surgical  History:  Procedure Laterality Date  . COLON SURGERY    . INGUINAL HERNIA REPAIR      Medical History: Past Medical History:  Diagnosis Date  . Allergy to alpha-gal   . Diabetes mellitus without complication (HCC)   . Hypertension   . Osteoporosis     Family History: Non contributory to the present illness  Social History: Social History   Socioeconomic History  . Marital status: Divorced    Spouse name: Not on file  . Number of children: Not on file  . Years of education: Not on file  . Highest  education level: Not on file  Occupational History  . Not on file  Tobacco Use  . Smoking status: Never Smoker  . Smokeless tobacco: Never Used  Substance and Sexual Activity  . Alcohol use: No  . Drug use: Never  . Sexual activity: Not on file  Other Topics Concern  . Not on file  Social History Narrative  . Not on file   Social Determinants of Health   Financial Resource Strain: Not on file  Food Insecurity: Not on file  Transportation Needs: Not on file  Physical Activity: Not on file  Stress: Not on file  Social Connections: Not on file  Intimate Partner Violence: Not on file    Vital Signs: Blood pressure 123/70, pulse 76, temperature 98.6 F (37 C), temperature source Temporal, resp. rate 20, height 5\' 8"  (1.727 m), weight 223 lb (101.2 kg), SpO2 97 %.  Examination: General Appearance: The patient is well-developed, well-nourished, and in no distress. Neck Circumference: 39 Skin: Gross inspection of skin unremarkable. Head: normocephalic, no gross deformities. Eyes: no gross deformities noted. ENT: ears appear grossly normal Neurologic: Alert and oriented. No involuntary movements.    EPWORTH SLEEPINESS SCALE:  Scale:  (0)= no chance of dozing; (1)= slight chance of dozing; (2)= moderate chance of dozing; (3)= high chance of dozing  Chance  Situtation    Sitting and reading: 1    Watching TV: 1    Sitting Inactive in public: 0    As a passenger in car: 0      Lying down to rest: 1    Sitting and talking: 0    Sitting quielty after lunch: 1    In a car, stopped in traffic: 0   TOTAL SCORE:   4 out of 24    SLEEP STUDIES:  1. PSG 04/03/07 OSA with AHI of 5 (REM 21/hr); Oxygen desaturation to 87%    CPAP COMPLIANCE DATA:  Date Range: 03/12/20 - 03/11/21  Average Daily Use: 7:49 hours  Median Use: 7:59 hrs  Compliance for > 4 Hours:98%  AHI: 0.4 respiratory events per hour  Days Used: 365/365  Mask Leak: 38.4 lpm  95th  Percentile Pressure: 8.0 cmh2O         LABS: No results found for this or any previous visit (from the past 2160 hour(s)).  Radiology: DG Chest 2 View  Result Date: 08/22/2020 CLINICAL DATA:  Chest pain. EXAM: CHEST - 2 VIEW COMPARISON:  August 18, 2015. FINDINGS: The heart size and mediastinal contours are within normal limits. Both lungs are clear. No pneumothorax or pleural effusion is noted. The visualized skeletal structures are unremarkable. IMPRESSION: No active cardiopulmonary disease. Electronically Signed   By: August 20, 2015 M.D.   On: 08/22/2020 10:06    No results found.  No results found.    Assessment and Plan: Patient Active Problem List   Diagnosis Date  Noted  . Degenerative joint disease involving multiple joints on both sides of body 03/15/2021  . OSA on CPAP 03/15/2021  . CPAP use counseling 03/15/2021  . Multinodular goiter 06/28/2018  . Diabetic neuropathy associated with type 2 diabetes mellitus (HCC) 05/08/2018  . Gastroesophageal reflux disease without esophagitis 06/08/2016  . Diabetes mellitus type 2, controlled, without complications (HCC) 09/22/2015  . Essential hypertension 09/22/2015   1. OSA on CPAP The patient does tolerate PAP and reports significant benefit from PAP use. The patient is caring for her  CPAP as recommended and advised to remind her surgeon that she has sleep apnea and not to go without it. The compliance has been excellent and the apnea is controlled. She has lost 15 lbs since last visit. OSA- continue excellent compliance   2. CPAP use counseling CPAP Counseling: had a lengthy discussion with the patient regarding the importance of PAP therapy in management of the sleep apnea. Patient appears to understand the risk factor reduction and also understands the risks associated with untreated sleep apnea. Patient will try to make a good faith effort to remain compliant with therapy. Also instructed the patient on proper cleaning  of the device including the water must be changed daily if possible and use of distilled water is preferred. Patient understands that the machine should be regularly cleaned with appropriate recommended cleaning solutions that do not damage the PAP machine for example given white vinegar and water rinses. Other methods such as ozone treatment may not be as good as these simple methods to achieve cleaning.  3. Essential hypertension Hypertension Counseling:   The following hypertensive lifestyle modification were recommended and discussed:  1. Limiting alcohol intake to less than 1 oz/day of ethanol:(24 oz of beer or 8 oz of wine or 2 oz of 100-proof whiskey). 2. Take baby ASA 81 mg daily. 3. Importance of regular aerobic exercise and losing weight. 4. Reduce dietary saturated fat and cholesterol intake for overall cardiovascular health. 5. Maintaining adequate dietary potassium, calcium, and magnesium intake. 6. Regular monitoring of the blood pressure. 7. Reduce sodium intake to less than 100 mmol/day (less than 2.3 gm of sodium or less than 6 gm of sodium choride)   4. Obesity (BMI 30-39.9) Obesity Counseling: Had a lengthy discussion regarding patients BMI and weight issues. Patient was instructed on portion control as well as increased activity. Also discussed caloric restrictions with trying to maintain intake less than 2000 Kcal. Discussions were made in accordance with the 5As of weight management. Simple actions such as not eating late and if able to, taking a walk is suggested.   General Counseling: I have discussed the findings of the evaluation and examination with Karen PaganiniAudrey.  I have also discussed any further diagnostic evaluation thatmay be needed or ordered today. Karen Andrews verbalizes understanding of the findings of todays visit. We also reviewed her medications today and discussed drug interactions and side effects including but not limited excessive drowsiness and altered mental states.  We also discussed that there is always a risk not just to her but also people around her. she has been encouraged to call the office with any questions or concerns that should arise related to todays visit.  No orders of the defined types were placed in this encounter.       I have personally obtained a history, examined the patient, evaluated laboratory and imaging results, formulated the assessment and plan and placed orders.  This patient was seen today by Emmaline KluverSarah Terrell, PA-C in  collaboration with Dr. Freda Munro.  Valentino Hue Sol Blazing, PhD, FAASM  Diplomate, American Board of Sleep Medicine    Yevonne Pax, MD [facility] Diplomate ABMS Pulmonary and Critical Care Medicine Sleep medicine

## 2021-03-15 ENCOUNTER — Ambulatory Visit (INDEPENDENT_AMBULATORY_CARE_PROVIDER_SITE_OTHER): Payer: Medicare Other | Admitting: Internal Medicine

## 2021-03-15 VITALS — BP 123/70 | HR 76 | Temp 98.6°F | Resp 20 | Ht 68.0 in | Wt 223.0 lb

## 2021-03-15 DIAGNOSIS — G4733 Obstructive sleep apnea (adult) (pediatric): Secondary | ICD-10-CM | POA: Diagnosis not present

## 2021-03-15 DIAGNOSIS — E669 Obesity, unspecified: Secondary | ICD-10-CM

## 2021-03-15 DIAGNOSIS — Z7189 Other specified counseling: Secondary | ICD-10-CM

## 2021-03-15 DIAGNOSIS — M159 Polyosteoarthritis, unspecified: Secondary | ICD-10-CM | POA: Insufficient documentation

## 2021-03-15 DIAGNOSIS — I10 Essential (primary) hypertension: Secondary | ICD-10-CM | POA: Diagnosis not present

## 2021-03-15 DIAGNOSIS — Z9989 Dependence on other enabling machines and devices: Secondary | ICD-10-CM | POA: Insufficient documentation

## 2021-03-15 NOTE — Patient Instructions (Signed)

## 2022-03-14 ENCOUNTER — Ambulatory Visit (INDEPENDENT_AMBULATORY_CARE_PROVIDER_SITE_OTHER): Payer: Medicare Other | Admitting: Internal Medicine

## 2022-03-14 VITALS — BP 136/74 | HR 71 | Resp 18 | Ht 68.0 in | Wt 228.0 lb

## 2022-03-14 DIAGNOSIS — G4733 Obstructive sleep apnea (adult) (pediatric): Secondary | ICD-10-CM | POA: Diagnosis not present

## 2022-03-14 DIAGNOSIS — I10 Essential (primary) hypertension: Secondary | ICD-10-CM | POA: Diagnosis not present

## 2022-03-14 DIAGNOSIS — E669 Obesity, unspecified: Secondary | ICD-10-CM

## 2022-03-14 DIAGNOSIS — Z9989 Dependence on other enabling machines and devices: Secondary | ICD-10-CM

## 2022-03-14 DIAGNOSIS — Z7189 Other specified counseling: Secondary | ICD-10-CM | POA: Diagnosis not present

## 2022-03-14 NOTE — Patient Instructions (Signed)

## 2022-03-14 NOTE — Progress Notes (Signed)
[facility] ?[address] ?[place], [place] [id number] ? ?Pulmonary Sleep Medicine  ? ?Office Visit Note ? ?Patient Name: Karen Andrews ?DOB: 1946-10-21 ?MRN [id number] ? ? ? ?Chief Complaint: Obstructive Sleep Apnea visit ? ?Brief History: ? ?Karen Andrews is seen today for follow up visit.   The patient has a 15 year history of sleep apnea. Patient is  using PAP nightly on medium Swift FX nasal pillows mask..  The patient feels better after sleeping with PAP.  The patient reports benefiting from PAP use. Epworth Sleepiness Score is 3 out of 24. The patientdoes not  take naps. The patient complains of the following: had not been able to sleep well in past few nights  The compliance download shows  compliance with an average use time of 7:40 hours @ 97%. The AHI is 0.5  The patient does not complain of limb movements disrupting sleep. ? ?ROS ? ?General: (-) fever, (-) chills, (-) night sweat ?Nose and Sinuses: (-) nasal stuffiness or itchiness, (-) postnasal drip, (-) nosebleeds, (-) sinus trouble. ?Mouth and Throat: (-) sore throat, (-) hoarseness. ?Neck: (-) swollen glands, (-) enlarged thyroid, (-) neck pain. ?Respiratory: - cough, - shortness of breath, - wheezing. ?Neurologic: - numbness, - tingling. ?Psychiatric: - anxiety, - depression ? ? ?Current Medication: ?Outpatient Encounter Medications as of 03/14/2022  ?Medication Sig  ? ascorbic Acid (VITAMIN C) 500 MG CPCR Take 1 capsule by mouth daily.  ? aspirin EC 81 MG tablet Take 81 mg by mouth at bedtime.  ? cetirizine (ZYRTEC) 10 MG tablet Take 10 mg by mouth daily.  ? Chromium (GTF CHROMIUM) 200 MCG TABS Take 200 mcg by mouth 2 (two) times daily.  ? ciclopirox (PENLAC) 8 % solution Apply 1 application topically at bedtime.  ? Cyanocobalamin 1000 MCG TBCR Take by mouth.  ? EPINEPHrine 0.3 mg/0.3 mL IJ SOAJ injection epinephrine 0.3 mg/0.3 mL injection, auto-injector  ? fluticasone (FLONASE) 50 MCG/ACT nasal spray 1 spray.  ? gabapentin (NEURONTIN) 100  MG capsule Take 100 mg by mouth at bedtime.  ? gabapentin (NEURONTIN) 600 MG tablet Take 0.5 tablets by mouth at bedtime.  ? glipiZIDE (GLUCOTROL XL) 10 MG 24 hr tablet Take 10 mg by mouth 2 (two) times daily.  ? glipiZIDE (GLUCOTROL XL) 10 MG 24 hr tablet Take by mouth.  ? glucose 4 GM chewable tablet Chew by mouth.  ? Hyaluronic Acid-Vitamin C (HYALURONIC ACID PO) Take 1 capsule by mouth daily.  ? hydrOXYzine (ATARAX/VISTARIL) 25 MG tablet Take by mouth.  ? losartan (COZAAR) 25 MG tablet Take 25 mg by mouth at bedtime.  ? losartan (COZAAR) 25 MG tablet Take by mouth.  ? meclizine (ANTIVERT) 25 MG tablet Take 1 tablet (25 mg total) by mouth 3 (three) times daily as needed for dizziness.  ? Multiple Minerals (CALCIUM/MAGNESIUM/ZINC PO) Take 1 tablet by mouth at bedtime.  ? Multiple Vitamin (MULTIVITAMIN WITH MINERALS) TABS tablet Take 1 tablet by mouth daily.  ? niacin (NIASPAN) 500 MG CR tablet Take by mouth.  ? niacin (NIASPAN) 750 MG CR tablet Take 750 mg by mouth at bedtime.  ? STUDY - ASPIRE - aspirin 81 mg or placebo tablet (PI-Sethi) Take by mouth.  ? Vitamin D, Cholecalciferol, 1000 UNITS CAPS Take 1,000 Units by mouth 2 (two) times daily.  ? zinc gluconate 50 MG tablet Take by mouth.  ? ?No facility-administered encounter medications on file as of 03/14/2022.  ? ? ?Surgical History: ?Past Surgical History:  ?Procedure Laterality Date  ?  COLON SURGERY    ? INGUINAL HERNIA REPAIR    ? ? ?Medical History: ?Past Medical History:  ?Diagnosis Date  ? Allergy to alpha-gal   ? Diabetes mellitus without complication (HCC)   ? Hypertension   ? Osteoporosis   ? ? ?Family History: ?Non contributory to the present illness ? ?Social History: ?Social History  ? ?Socioeconomic History  ? Marital status: Divorced  ?  Spouse name: Not on file  ? Number of children: Not on file  ? Years of education: Not on file  ? Highest education level: Not on file  ?Occupational History  ? Not on file  ?Tobacco Use  ? Smoking status: Never   ? Smokeless tobacco: Never  ?Substance and Sexual Activity  ? Alcohol use: No  ? Drug use: Never  ? Sexual activity: Not on file  ?Other Topics Concern  ? Not on file  ?Social History Narrative  ? Not on file  ? ?Social Determinants of Health  ? ?Financial Resource Strain: Not on file  ?Food Insecurity: Not on file  ?Transportation Needs: Not on file  ?Physical Activity: Not on file  ?Stress: Not on file  ?Social Connections: Not on file  ?Intimate Partner Violence: Not on file  ? ? ?Vital Signs: ?There were no vitals taken for this visit. ?There is no height or weight on file to calculate BMI.  ? ? ?Examination: ?General Appearance: The patient is well-developed, well-nourished, and in no distress. ?Neck Circumference: 39 cm ?Skin: Gross inspection of skin unremarkable. ?Head: normocephalic, no gross deformities. ?Eyes: no gross deformities noted. ?ENT: ears appear grossly normal ?Neurologic: Alert and oriented. No involuntary movements. ? ? ? ?EPWORTH SLEEPINESS SCALE: ? ?Scale:  ?(0)= no chance of dozing; (1)= slight chance of dozing; (2)= moderate chance of dozing; (3)= high chance of dozing ? ?Chance  Situtation ?   ?Sitting and reading: 1 ?  ? Watching TV: 1 ?   ?Sitting Inactive in public: 0 ?   ?As a passenger in car: 0   ?   ?Lying down to rest: 0 ?   ?Sitting and talking: 0 ?   ?Sitting quielty after lunch: 1 ?   ?In a car, stopped in traffic: 0 ? ? ?TOTAL SCORE:   3 out of 24 ? ? ? ?SLEEP STUDIES: ? ?PSG (04/08) OSA with AHI of 5 (REM 21/hr); Oxygen desaturation to 87% ? ? ?CPAP COMPLIANCE DATA: ? ?Date Range: 03/11/21 - 03/10/22 ? ?Average Daily Use: 7:40 hours ? ?Median Use: 7:47 hours ? ?Compliance for > 4 Hours: 97% days ? ?AHI: 0.5 respiratory events per hour ? ?Days Used: 363/365 ? ?Mask Leak: 41.9 LPM ? ?95th Percentile Pressure: 8 cmH2O ? ? ?LABS: ?No results found for this or any previous visit (from the past 2160 hour(s)). ? ?Radiology: ?DG Chest 2 View ? ?Result Date: 08/22/2020 ?CLINICAL DATA:   Chest pain. EXAM: CHEST - 2 VIEW COMPARISON:  August 18, 2015. FINDINGS: The heart size and mediastinal contours are within normal limits. Both lungs are clear. No pneumothorax or pleural effusion is noted. The visualized skeletal structures are unremarkable. IMPRESSION: No active cardiopulmonary disease. Electronically Signed   By: Lupita Raider M.D.   On: 08/22/2020 10:06  ? ? ?No results found. ? ?No results found. ? ? ? ?Assessment and Plan: ?Patient Active Problem List  ? Diagnosis Date Noted  ? Degenerative joint disease involving multiple joints on both sides of body 03/15/2021  ? OSA on CPAP 03/15/2021  ?  CPAP use counseling 03/15/2021  ? Multinodular goiter 06/28/2018  ? Diabetic neuropathy associated with type 2 diabetes mellitus (HCC) 05/08/2018  ? Gastroesophageal reflux disease without esophagitis 06/08/2016  ? Diabetes mellitus type 2, controlled, without complications (HCC) 09/22/2015  ? Essential hypertension 09/22/2015  ? ? ?1. OSA on CPAP ?The patient does tolerate PAP and reports  benefit from PAP use. The patient was reminded how to clean equipment and advised to replace supplies routinely. The patient was also counselled on weight loss. The compliance is excellent. The AHI is 0.5. ? ? ?OSA- continue with excellent compliance with pap. F/u one year.  ? ? ?2. CPAP use counseling ?CPAP Counseling: had a lengthy discussion with the patient regarding the importance of PAP therapy in management of the sleep apnea. Patient appears to understand the risk factor reduction and also understands the risks associated with untreated sleep apnea. Patient will try to make a good faith effort to remain compliant with therapy. Also instructed the patient on proper cleaning of the device including the water must be changed daily if possible and use of distilled water is preferred. Patient understands that the machine should be regularly cleaned with appropriate recommended cleaning solutions that do not damage  the PAP machine for example given white vinegar and water rinses. Other methods such as ozone treatment may not be as good as these simple methods to achieve cleaning.  ? ?3. Essential hypertension ?H

## 2022-10-17 ENCOUNTER — Ambulatory Visit
Admission: EM | Admit: 2022-10-17 | Discharge: 2022-10-17 | Disposition: A | Discharge status: Home / Self Care | Payer: Medicare Other | Attending: Emergency Medicine | Admitting: Emergency Medicine

## 2022-10-17 DIAGNOSIS — M25512 Pain in left shoulder: Secondary | ICD-10-CM

## 2022-10-17 DIAGNOSIS — M549 Dorsalgia, unspecified: Secondary | ICD-10-CM | POA: Diagnosis not present

## 2022-10-17 NOTE — Discharge Instructions (Addendum)
Take Tylenol as needed for discomfort.  Follow up with your primary care provider if your symptoms are not improving.

## 2022-10-17 NOTE — ED Provider Notes (Signed)
Karen Andrews    CSN: [id number] Arrival date & time: 10/17/22  1421      History   Chief Complaint Chief Complaint  Patient presents with   Shoulder Pain    HPI Karen Andrews is a 76 y.o. female.  Patient presents with left shoulder and mid back pain x 3 days.  The pain started when she was lifting heavy boxes; she heard a "pop."  Treatment attempted at home with ice packs and Advil.  She denies chest pain, shortness of breath, numbness, weakness, paresthesias, redness, bruising, swelling, or other symptoms.  Her medical history includes hypertension, diabetes, degenerative joint disease, osteoporosis, GERD, goiter.  Patient is currently wearing a cardiac monitor which was placed today at cardiologist for palpitations.   The history is provided by the patient and medical records.    Past Medical History:  Diagnosis Date   Allergy to alpha-gal    Diabetes mellitus without complication (HCC)    Hypertension    Osteoporosis     Patient Active Problem List   Diagnosis Date Noted   Obesity (BMI 30-39.9) 03/14/2022   Degenerative joint disease involving multiple joints on both sides of body 03/15/2021   OSA on CPAP 03/15/2021   CPAP use counseling 03/15/2021   Multinodular goiter 06/28/2018   Diabetic neuropathy associated with type 2 diabetes mellitus (HCC) 05/08/2018   Gastroesophageal reflux disease without esophagitis 06/08/2016   Diabetes mellitus type 2, controlled, without complications (HCC) 09/22/2015   Essential hypertension 09/22/2015    Past Surgical History:  Procedure Laterality Date   COLON SURGERY     INGUINAL HERNIA REPAIR      OB History   No obstetric history on file.      Home Medications    Prior to Admission medications   Medication Sig Start Date End Date Taking? Authorizing Provider  ascorbic Acid (VITAMIN C) 500 MG CPCR Take 1 capsule by mouth daily.    [provider]  ascorbic Acid (VITAMIN C) 500 MG CPCR Take 1  capsule by mouth daily as needed.    [provider]  aspirin 81 MG chewable tablet Chew by mouth.    [provider]  aspirin EC 81 MG tablet Take 81 mg by mouth at bedtime.    [provider]  cetirizine (ZYRTEC) 10 MG tablet Take 10 mg by mouth daily.    [provider]  Chromium (GTF CHROMIUM) 200 MCG TABS Take 200 mcg by mouth 2 (two) times daily.    [provider]  Chromium Picolinate 200 MCG TABS Take by mouth.    [provider]  ciclopirox (PENLAC) 8 % solution Apply 1 application topically at bedtime.    [provider]  Cyanocobalamin 1000 MCG TBCR Take by mouth.    [provider]  EPINEPHrine 0.3 mg/0.3 mL IJ SOAJ injection epinephrine 0.3 mg/0.3 mL injection, auto-injector 09/26/17   [provider]  fluticasone (FLONASE) 50 MCG/ACT nasal spray 1 spray. 02/22/15   [provider]  gabapentin (NEURONTIN) 100 MG capsule Take 100 mg by mouth at bedtime.    [provider]  gabapentin (NEURONTIN) 600 MG tablet Take 0.5 tablets by mouth at bedtime. 01/03/17   [provider]  glipiZIDE (GLUCOTROL XL) 10 MG 24 hr tablet Take 10 mg by mouth 2 (two) times daily.    [provider]  glipiZIDE (GLUCOTROL XL) 10 MG 24 hr tablet Take by mouth. 08/26/15   [provider]  glipiZIDE (GLUCOTROL  XL) 10 MG 24 hr tablet Take by mouth. 08/26/15   [provider]  glucose 4 GM chewable tablet Chew by mouth. 11/21/19   [provider]  Hyaluronic Acid-Vitamin C (HYALURONIC ACID PO) Take 1 capsule by mouth daily.    [provider]  hydrOXYzine (ATARAX) 25 MG tablet 25 mg every four (4) hours as needed. 09/23/20   [provider]  hydrOXYzine (ATARAX/VISTARIL) 25 MG tablet Take by mouth. 09/09/20   [provider]  losartan (COZAAR) 25 MG tablet Take 25 mg by mouth at bedtime.    [provider]  losartan (COZAAR) 25 MG tablet Take by  mouth. 11/10/20   [provider]  losartan (COZAAR) 25 MG tablet Two (2) times a day. 11/10/20   [provider]  meclizine (ANTIVERT) 25 MG tablet Take 1 tablet (25 mg total) by mouth 3 (three) times daily as needed for dizziness. 12/28/16   Orbie Pyo, MD  Methylsulfonylmethane 1000 MG CAPS MSM 1,000 mg capsule    [provider]  Multiple Minerals (CALCIUM/MAGNESIUM/ZINC PO) Take 1 tablet by mouth at bedtime.    [provider]  Multiple Vitamin (MULTIVITAMIN WITH MINERALS) TABS tablet Take 1 tablet by mouth daily.    [provider]  niacin (NIASPAN) 500 MG CR tablet Take by mouth. 03/06/15   [provider]  niacin (NIASPAN) 500 MG CR tablet Take 1 tablet by mouth at bedtime. 04/19/21   [provider]  niacin (NIASPAN) 750 MG CR tablet Take 750 mg by mouth at bedtime.    [provider]  STUDY - ASPIRE - aspirin 81 mg or placebo tablet (PI-Sethi) Take by mouth.    [provider]  Vitamin D, Cholecalciferol, 1000 UNITS CAPS Take 1,000 Units by mouth 2 (two) times daily.    [provider]  zinc gluconate 50 MG tablet Take by mouth.    [provider]  zinc gluconate 50 MG tablet Take by mouth.    [provider]    Family History No family history on file.  Social History Social History   Tobacco Use   Smoking status: Never   Smokeless tobacco: Never  Substance Use Topics   Alcohol use: No   Drug use: Never     Allergies   Beef (bovine) protein, Beef allergy, Gelatin, Meloxicam, Pork allergy, Zoster vaccine live, Atorvastatin, Cetirizine, Chlorhexidine gluconate, Clindamycin/lincomycin, Metformin and related, Pioglitazone, Flexeril [cyclobenzaprine], Meat [alpha-gal], Latex, Milk (cow), Nickel, and Other   Review of Systems Review of Systems  Respiratory:  Negative for cough and shortness of breath.   Cardiovascular:  Negative for chest pain and  palpitations.  Gastrointestinal:  Negative for abdominal pain, nausea and vomiting.  Musculoskeletal:  Positive for arthralgias and back pain. Negative for joint swelling.  Skin:  Negative for color change, rash and wound.  Neurological:  Negative for weakness and numbness.  All other systems reviewed and are negative.    Physical Exam Triage Vital Signs ED Triage Vitals  Enc Vitals Group     BP 10/17/22 1431 136/80     Pulse Rate 10/17/22 1431 72     Resp 10/17/22 1431 18     Temp 10/17/22 1431 97.9 F (36.6 C)     Temp src --      SpO2 10/17/22 1431 98 %     Weight 10/17/22 1429 210 lb (95.3 kg)     Height 10/17/22 1429 5\' 8"  (1.727 m)  Head Circumference --      Peak Flow --      Pain Score 10/17/22 1429 6     Pain Loc --      Pain Edu? --      Excl. in GC? --    No data found.  Updated Vital Signs BP 136/80   Pulse 72   Temp 97.9 F (36.6 C)   Resp 18   Ht 5\' 8"  (1.727 m)   Wt 210 lb (95.3 kg)   SpO2 98%   BMI 31.93 kg/m   Visual Acuity Right Eye Distance:   Left Eye Distance:   Bilateral Distance:    Right Eye Near:   Left Eye Near:    Bilateral Near:     Physical Exam Vitals and nursing note reviewed.  Constitutional:      General: She is not in acute distress.    Appearance: Normal appearance. She is well-developed. She is not ill-appearing.  HENT:     Mouth/Throat:     Mouth: Mucous membranes are moist.  Cardiovascular:     Rate and Rhythm: Normal rate and regular rhythm.     Heart sounds: Normal heart sounds.  Pulmonary:     Effort: Pulmonary effort is normal. No respiratory distress.     Breath sounds: Normal breath sounds.  Musculoskeletal:        General: No swelling, tenderness, deformity or signs of injury. Normal range of motion.       Arms:     Cervical back: Neck supple.     Comments: Left shoulder, LUE, left mid-back: FROM, sensation intact, strength 5/5, 2+ pulses. No ecchymosis, erythema, wounds, edema.   Skin:     General: Skin is warm and dry.     Capillary Refill: Capillary refill takes less than 2 seconds.     Findings: No bruising, erythema, lesion or rash.  Neurological:     General: No focal deficit present.     Mental Status: She is alert and oriented to person, place, and time.     Sensory: No sensory deficit.     Motor: No weakness.     Gait: Gait normal.  Psychiatric:        Mood and Affect: Mood normal.        Behavior: Behavior normal.      UC Treatments / Results  Labs (all labs ordered are listed, but only abnormal results are displayed) Labs Reviewed - No data to display  EKG   Radiology No results found.  Procedures Procedures (including critical care time)  Medications Ordered in UC Medications - No data to display  Initial Impression / Assessment and Plan / UC Course  I have reviewed the triage vital signs and the nursing notes.  Pertinent labs & imaging results that were available during my care of the patient were reviewed by me and considered in my medical decision making (see chart for details).   Pain in left shoulder and mid back.  EKG shows sinus bradycardia, rate 59, no ST elevation, compared to previous from 08/26/2020.  No xray today as patient is wearing a cardiac monitor and her shoulder exam is reassuring.  Discussed symptomatic treatment including Tylenol.  Instructed patient to follow up with her PCP if her symptoms are not improving.  She agrees to plan of care.     Final Clinical Impressions(s) / UC Diagnoses   Final diagnoses:  Acute pain of left shoulder  Acute mid back pain  Discharge Instructions      Take Tylenol as needed for discomfort.  Follow up with your primary care provider if your symptoms are not improving.        ED Prescriptions   None    PDMP not reviewed this encounter.   Mickie Bail, NP 10/17/22 [phone]

## 2022-10-17 NOTE — ED Triage Notes (Signed)
Patient to Urgent Care with complaints of left sided rib pain and posterior left shoulder pain. Pain started three days ago. Patient reports that she was lifting boxes prior to pain. States she heard something pop on her left shoulder.   Has been applying ice and taking advil.

## 2022-12-19 DEATH — deceased
